# Patient Record
Sex: Male | Born: 1985 | Hispanic: Yes | Marital: Married | Smoking: Never smoker
Health system: Southern US, Community
[De-identification: ages and names within clinical notes are randomized; demographics above are authoritative.]

---

## 2014-03-22 ENCOUNTER — Emergency Department: Payer: Self-pay | Admitting: Emergency Medicine

## 2016-01-02 ENCOUNTER — Encounter: Payer: Self-pay | Admitting: Emergency Medicine

## 2016-01-02 ENCOUNTER — Emergency Department
Admission: EM | Admit: 2016-01-02 | Discharge: 2016-01-02 | Disposition: A | Discharge status: Home / Self Care | Payer: Self-pay | Attending: Emergency Medicine | Admitting: Emergency Medicine

## 2016-01-02 DIAGNOSIS — W11XXXA Fall on and from ladder, initial encounter: Secondary | ICD-10-CM | POA: Insufficient documentation

## 2016-01-02 DIAGNOSIS — M5410 Radiculopathy, site unspecified: Secondary | ICD-10-CM | POA: Insufficient documentation

## 2016-01-02 DIAGNOSIS — Y929 Unspecified place or not applicable: Secondary | ICD-10-CM | POA: Insufficient documentation

## 2016-01-02 DIAGNOSIS — Y9389 Activity, other specified: Secondary | ICD-10-CM | POA: Insufficient documentation

## 2016-01-02 DIAGNOSIS — Y990 Civilian activity done for income or pay: Secondary | ICD-10-CM | POA: Insufficient documentation

## 2016-01-02 DIAGNOSIS — Z87891 Personal history of nicotine dependence: Secondary | ICD-10-CM | POA: Insufficient documentation

## 2016-01-02 DIAGNOSIS — S39012A Strain of muscle, fascia and tendon of lower back, initial encounter: Secondary | ICD-10-CM | POA: Insufficient documentation

## 2016-01-02 MED ORDER — KETOROLAC TROMETHAMINE 10 MG PO TABS: 10.0000 mg | ORAL_TABLET | Freq: Three times a day (TID) | ORAL | Status: AC

## 2016-01-02 MED ORDER — CYCLOBENZAPRINE HCL 5 MG PO TABS: 5.0000 mg | ORAL_TABLET | Freq: Three times a day (TID) | ORAL | Status: AC | PRN

## 2016-01-02 MED ORDER — DIAZEPAM 5 MG PO TABS
5.0000 mg | ORAL_TABLET | Freq: Once | ORAL | Status: AC
  Administered 2016-01-02: 5 mg via ORAL
  Filled 2016-01-02: qty 1

## 2016-01-02 MED ORDER — ORPHENADRINE CITRATE 30 MG/ML IJ SOLN: 60.0000 mg | INTRAMUSCULAR | Status: DC

## 2016-01-02 MED ORDER — KETOROLAC TROMETHAMINE 60 MG/2ML IM SOLN
30.0000 mg | Freq: Once | INTRAMUSCULAR | Status: AC
  Administered 2016-01-02: 30 mg via INTRAMUSCULAR
  Filled 2016-01-02: qty 2

## 2016-01-02 NOTE — Discharge Instructions (Signed)
Prevencin de las lesiones de la espalda (Back Injury Prevention) Las lesiones de la espalda pueden ser muy dolorosas. Adems son difciles de curar. Despus de haber tenido una lesin de la espalda, tiene ms probabilidad de lesionarse otra vez. Es importante que aprenda cmo evitar lesionarse o volver a Control and instrumentation engineer. Los siguientes consejos pueden ayudarlo a Product/process development scientist una lesin de la espalda. QU DEBO SABER SOBRE EL ESTADO FSICO?  Haga ejercicios durante 27mnutos diarios la mHartford Financialde la semana o como se lo haya indicado el mdico. AChief Strategy Officerde lo siguiente:  HSherilyn Cooterejercicios aerbicos, como caminar, trotar, andar en bicicleta o nadar.  Haga ejercicios que mejoren el equilibrio y la fuerza, cWatovatai chi y el yoga. Estos pueden reducir el riesgo de que se caiga y se lesione la espalda.  Haga ejercicios de elongacin para ayudar a la flexibilidad.  Intente desarrollar msculos abdominales fuertes. Los msculos del abdomen brindan gran parte del sostn que la espalda necesita.  Mantenga un peso saludable. Esto ayuda a reducir eCatering managerde sufrir una lesin de la espalda. QU DEBO SABER SOBRE LA DGrand [place]  Hable con el mdico sobre la dieta en general. TBassettvitaminas y los suplementos solamente como se lo haya indicado el mdico.  Hable con el mdico sobre la cantidad de calcio y vitaminaD que necesita a diario. Estos nutrientes ayudan a eInsurance risk surveyorde los huesos (osteoporosis). La osteoporosis puede derivar en fracturas seas, lo que puede causar dolor de espalda.  Incluya buenas fuentes de calcio en la dieta, como productos lcteos, verduras de hojas verdes y productos con agregado de calcio (fortificados).  Incluya buenas fuentes de vitaminaD en la dieta, como leche y alimentos fortificados con esta vitamina. QAllen  Sintese y prese erguido. No se incline hacia adelante al sentarse ni se encorve al pararse.  Elija  las sillas con un buen apoyo para la zona baja de la espalda (lumbar).  Si trabaja en un escritorio, sintese cerca de este para no tener que inclinarse. Mantenga el mentn hacia abajo. Mantenga el cuello hacia atrs y los codos flexionados en ngulo recto. La posicin de los brazos debe verse como la letra "L".  Cuando conduzca, sintese elevado y cerca del volante. Agregue un apoyo para la zona lumbar al asiento del automvil, si es necesario.  No permanezca sentado ni parado en una posicin durante mThe PNC Financial Tmese descansos para pararse, estirarse y caminar, al menos una vez por hora. Tmese descansos cada hora si conduce durante perodos largos de tiempo.  Duerma de costado con las rodillas apenas dobladas o boca arriba con una almohada debajo de las rodillas. No duerma boca abajo. QWest Haverstraw SOBRE LOS MOVIMIENTOS DE TORSIN Y LOS DE ESTIRAMIENTO Levantamiento de objetos y de cargas pesadas  No levante cargas pesadas y evite especialmente el levantamiento repetitivo de objetos pesados. Si debe levantar cargas pesadas:  Elongue antes de hacerlo.  Trabaje lentamente.  Descanse despus de cada levantamiento.  Use una herramienta, como un carrito o una plataforma mvil para mover los objetos, si hay una disponible.  Haga varios viajes cortos, en lugar de llevar una carga pesada.  Pida ayuda cuando la necesite, en especial cuando mueva objetos de gran tamao.  Siga estos pasos cuando levante objetos:  Prese con los pies separados al ancho de los hombros.  Acrquese al objeto tanto como pueda. No intente levantar un objeto pesado que est lejos  de su cuerpo.  Use agarraderas o correas de elevacin si estn disponibles.  [place]. Agchese, pero mantenga los [employer].  Mantenga los hombros [place] atrs, el mentn hacia abajo y la espalda derecha.  Levante lentamente el objeto mientras contrae los msculos de las  piernas, el abdomen y los glteos. Mantenga el objeto tan cerca del centro del cuerpo como sea posible.  Siga estos pasos cuando baje una carga pesada:  Prese con los pies separados al ancho de los hombros.  Baje lentamente el objeto mientras contrae los msculos de las piernas, el abdomen y los glteos. Mantenga el objeto tan cerca del centro del cuerpo como sea posible.  Mantenga los hombros [place] atrs, el mentn hacia abajo y la espalda derecha.  [place]. Agchese, pero mantenga los [employer]in.  Use agarraderas o correas de elevacin si estn disponibles. Movimientos de torsin y de estiramiento  No levante objetos pesados por encima del nivel de la cintura.  No tuerza la cintura mientras levanta o transporta una carga. Si tiene que girar, [employer].  No se incline sin flexionar las rodillas.  No se estire para alcanzar un objeto que est por encima de su cabeza, al otro lado de una mesa o sobre una superficie elevada. [place]?  Evite los pisos mojados y los suelos helados. Retire el hielo de las aceras para evitar las cadas.  No duerma sobre un colchn muy blando ni muy duro.  Mantenga los objetos que [place] a menudo en lugares de fcil acceso.  Coloque los objetos ms pesados en estantes a nivel de la cintura y los ms livianos en estantes ms bajos o ms altos.  Encuentre formas de reducir Dealer, como hacer ejercicios, darse masajes o aplicar tcnicas de relajacin. El estrs puede [employer]. Los msculos en estado de tensin son ms propensos a las lesiones.  Hable con el mdico si se siente ansioso o deprimido. Estas afecciones pueden intensificar el dolor de espalda.  Use calzado sin tacones con suelas acolchonadas.  Evite los movimientos repentinos.  Use ambas correas de sujecin cuando cargue una mochila.  No consuma ningn producto que contenga tabaco, lo que incluye cigarrillos, tabaco  de Higher education careers adviser o Psychologist, sport and exercise. Si necesita ayuda para dejar de fumar, consulte al mdico.   Esta informacin no tiene Marine scientist el consejo del mdico. Asegrese de hacerle al mdico cualquier pregunta que tenga.   Document Released: 07/30/2005 Document Revised: 12/14/2014 Elsevier Interactive Patient Education 2016 [place] lumbosacra (Lumbosacral Strain) La distensin lumbosacra es una distensin de cualquiera de las partes que componen las vrtebras lumbosacras. Las vrtebras lumbosacras son los huesos que conforman el tercio inferior de la columna vertebral. Estas vrtebras estn sostenidas por msculos y un resistente tejido fibroso (ligamentos).  CAUSAS  Un golpe repentino en la espalda puede provocar una distensin lumbosacra. Adems, cualquier tipo de movimiento que cause una elongacin excesiva de los msculos de la zona lumbar puede provocar este tipo de distensin. Esto se ve normalmente en las personas que se esfuerzan demasiado, se caen, levantan objetos pesados, se agachan o estn en cuclillas con regularidad. [place] agotador.  Participar en deportes en los cuales se deba empujar o tirar y que requieren de un giro repentino de la espalda (tenis, golf, bisbol).  Levantar peso.  Curvatura excesiva de la zona lumbar.  Pelvis hacia adelante.  Espalda o msculos abdominales dbiles,  o ambos.  Tendones isquiotibiales tensos. SIGNOS Y SNTOMAS  La distensin lumbosacra puede provocar dolor en la zona de la lesin o un dolor que baja (se extiende) hasta la pierna.  DIAGNSTICO Con frecuencia, el mdico puede diagnosticar una distensin Starbucks Corporation un examen fsico. En algunos casos, es posible que deba realizarse pruebas, como una [place].  TRATAMIENTO  El tratamiento para la lesin lumbar depende de muchos factores que el mdico Personnel officer. Sin embargo, la [employer] de los tratamientos incluye el uso de  antiinflamatorios. INSTRUCCIONES PARA EL CUIDADO EN EL HOGAR   Evite actividades fsicas difciles (tenis, raquetbol, esqu acutico) si no tiene un buen estado fsico para practicarlas. Esto puede Museum/gallery conservator.  Si tiene un problema en la espalda, evite los deportes que requieren de movimientos corporales bruscos. La natacin y las caminatas son las actividades ms seguras.  Mantenga una buena postura.  Mantenga un peso saludable.  En el caso de episodios agudos, puede colocar hielo en la zona lesionada.  Ponga el hielo en una bolsa plstica.  Coloque una toalla entre la piel y la bolsa de hielo.  Deje el hielo durante 20 minutos, 2 a 3 veces por da.  Cuando la zona lumbar comience a sanar, es posible que le recomienden ejercicios de elongacin y fortalecimiento. SOLICITE ATENCIN MDICA SI:  El dolor de Loss adjuster, chartered.  Tiene un dolor de espalda intenso que no mejora con medicamentos. SOLICITE ATENCIN [place] DE INMEDIATO SI:   Siente entumecimiento, hormigueo, debilidad o problemas con el uso de los brazos o las piernas.  Nota cambios en el control de la vejiga o el intestino.  Siente un aumento del Chief Operating Officer parte del cuerpo, incluido el vientre (abdomen).  Nota que le falta el aire, se siente mareado o se desmaya.  Tiene Higher education careers adviser (nuseas), vomita o comienza a sudar.  Nota un cambio de color en los dedos del pie o las piernas, o los pies se ponen muy fros. ASEGRESE DE QUE:   Comprende estas instrucciones.  Controlar su afeccin.  Recibir ayuda de inmediato si no mejora o si empeora.   Esta informacin no tiene Marine scientist el consejo del mdico. Asegrese de hacerle al mdico cualquier pregunta que tenga.   Document Released: 05/09/2005 Document Revised: 08/04/2013 Elsevier Interactive Patient Education 2016 [place] Radicular (Radicular Pain) El dolor radicular est causado en la irritacin de las  races [place]. Generalmente la causa es una degeneracin el un disco de la columna vertebral. Esto produce dolor que se siente en el brazo o la pierna. Entre otras causas del dolor radicular se incluyen:  Designer, jewellery  Neuropatas (un estado anormal y a menudo degenerativo del sistema nervioso o nervios). Para el diagnstico le indicarn una tomografa computarizada o imgenes por resonancia magntica para determinar la causa primaria. Los nervios que comienzan en el cuello (races nerviosas) pueden Psychologist, clinical radicular en la parte exterior del hombro y en el brazo. ste puede extenderse hacia el pulgar y los dedos. Los sntomas varan segn la raz nerviosa afectada. En la mayor parte de los casos mejora en gran medida con un tratamiento conservador. Para los problemas en el cuello le indicarn fisioterapia, un collar o una traccin cervical. El tratamiento puede durar varias semanas y se considerar la posibilidad de una ciruga si los sntomas no mejoran.  El tratamiento conservador tambin se recomienda para los casos de citica que causan dolor que se irradia desde la zona  baja de la espalda o nalgas hacia la pierna o el pie. Generalmente hay una historia previa de problemas en la espalda. La mayora de los pacientes mejora completamente luego de 2 a 4 semanas de reposo en cama y otros tratamientos de apoyo. El reposo en cama reduce la presin en el disco considerablemente. El estar sentado no es una buena posicin, ya que aumenta la presin sobre el disco. Evite encorvarse, levantar mucho peso, permanecer sentado por [employer], y las actividades que puedan empeorar el problema. El los casos ms graves se Film/video editor traccin. La ciruga est reservada para los pacientes que no mejoran dentro de los primeros meses de [place]. Utilice los medicamentos de venta libre o de prescripcin para Conservation officer, historic buildings, Health and safety inspector o la [place], segn se lo indique el  profesional que lo asiste. Los narcticos y los relajantes musculares ayudan a Academic librarian dolor ms intenso y los espasmos, proporcionando una sedacin [place]. Venida Jarvis de fro o masajes tambin le brindarn [place]. No se recomienda la manipulacin de la columna vertebral. Esto puede aumentar el grado de protrusin del disco. Las inyecciones epidurales con esteroides son a menudo un tratamiento efectivo para el dolor radicular. Esas inyecciones colocan medicamentos al nervio espinal en el espacio entre la cubierta protectora de la mdula espinal y los huesos de la espalda (vertebrae). El profesional que lo asiste podr darle ms informacin acerca de las inyecciones de esteroides. Estas inyecciones son ms efectivas cuando se administran dentro de las [employer]V de la aparicin del Social research officer, government.  Comunquese con su mdico para realizar un control segn las indicaciones. Como parte importante del tratamiento deber seguir un programa de rehabilitacin, con ejercicios de elongacin y fortalecimiento.  SOLICITE ATENCIN MDICA DE INMEDIATO SI:  Presenta siente debilidad o adormecimiento inusual en sus brazos o piernas.  Presenta prdida del control del intestino o de la vejiga.  Presenta dolor abdominal.   Esta informacin no tiene como fin reemplazar el consejo del mdico. Asegrese de hacerle al mdico cualquier pregunta que tenga.   Document Released: 07/30/2005 Document Revised: 10/22/2011 Elsevier Interactive Patient Education 2016 [place] the prescription meds as directed. Rest but do not lay in bed all day. Apply ice or moist heat to the lower back for pain. Follow-up with [facility] for continued symptoms.   ===================================================================================  Tome la medicina que se le ha recetado siguiendo las instrucciones.  Descanse pero no se acueste en la cama todo el da. Ponga hielo o paos calientes y hmedos en la parte baja de la espalda  para el dolor. Haga seguimiento con [facility] si contina con sntomas.

## 2016-01-02 NOTE — ED Notes (Addendum)
States back pain x 1 week, States radiates to L leg. States slipped on stairs at work on Monday. Works for [employer], Nurse, learning disability, [place].

## 2016-01-02 NOTE — ED Notes (Signed)
States he is having pain to lower back for about 1 1/2 weeks   States he fell from ladder ambulates well to treatment area

## 2016-01-02 NOTE — ED Provider Notes (Signed)
[facility] Emergency Department Provider Note ____________________________________________  Time seen: 1518  I have reviewed the triage vital signs and the nursing notes.  HISTORY  Chief Complaint  Back Pain  History limited by Spanish language. Interpreter Jeanie Sewer) present during the interview and exam.  HPI  Todd Harper is a 30 y.o. male who presents to the ED for evaluation of continued LBP since injury, 1 1/2 weeks PTA. He describes his initial injury occurred when he fell from a scaffold ramp. He fell from about 5 steps above next level. He describes pain to the lower back that radiates into his left leg. He denies bladder or bowel incontinence, leg weakness, or foot drop. He was evaluated at [facility]gent care on the day of the accident. He was x-rayed and discharged with prescriptions for Flexeril and Naprosyn. He returned about 5 days later for re-evaluation with continued symptoms. He was returned to work with modified duty, but reports the supervisors did not honor his restrictions. He rates his discomfort at 7/10 in triage.    History reviewed. No pertinent past medical history.  There are no active problems to display for this patient.  History reviewed. No pertinent past surgical history.  Current Outpatient Rx  Name  Route  Sig  Dispense  Refill  . cyclobenzaprine (FLEXERIL) 5 MG tablet   Oral   Take 1 tablet (5 mg total) by mouth 3 (three) times daily as needed for muscle spasms.   15 tablet   0   . ketorolac (TORADOL) 10 MG tablet   Oral   Take 1 tablet (10 mg total) by mouth every 8 (eight) hours.   15 tablet   0    Allergies Review of patient's allergies indicates no known allergies.  No family history on file.  Social History Social History  Substance Use Topics  . Smoking status: Former Research scientist (life sciences)  . Smokeless tobacco: None  . Alcohol Use: No   Review of Systems  Constitutional: Negative for fever. Respiratory:  Negative for shortness of breath. Gastrointestinal: Negative for abdominal pain, vomiting and diarrhea. Genitourinary: Negative for dysuria. Musculoskeletal: Positive for back pain. Neurological: Negative for headaches, focal weakness or numbness. ____________________________________________  PHYSICAL EXAM:  VITAL SIGNS: ED Triage Vitals  Enc Vitals Group     BP 01/02/16 1357 147/89 mmHg     Pulse Rate 01/02/16 1357 92     Resp 01/02/16 1357 20     Temp 01/02/16 1357 97.7 F (36.5 C)     Temp Source 01/02/16 1357 Oral     SpO2 01/02/16 1357 97 %     Weight 01/02/16 1358 195 lb (88.451 kg)     Height 01/02/16 1358 5\' 5"  (1.651 m)     Head Cir --      Peak Flow --      Pain Score 01/02/16 1358 7     Pain Loc --      Pain Edu? --      Excl. in [place]? --    Constitutional: Alert and oriented. Well appearing and in no distress. Head: Normocephalic and atraumatic. Cardiovascular: Normal rate, regular rhythm.  Respiratory: Normal respiratory effort. No wheezes/rales/rhonchi. Gastrointestinal: Soft and nontender. No distention. Musculoskeletal: Normal spinal alignment without midline tenderness, spasm, deformity, or step-off. Patient with normal transition from sit to stand. He has a negative Trendelenburg on exam. He is able to toe raise and heel raise without difficulty. He has full lumbar flexion and limited extension secondary to pain.  This tenderness is localized to the left lumbar sacral junction. Nontender with normal range of motion in all extremities.  Neurologic:  CNs 2 through 12 grossly intact. Normal LE DTRs bilaterally. Normal gait without ataxia. Normal speech and language. No gross focal neurologic deficits are appreciated. Skin:  Skin is warm, dry and intact. No rash noted. ____________________________________________  PROCEDURES  Toradol 30 mg IM Valium 5 mg PO ____________________________________________  INITIAL IMPRESSION / ASSESSMENT AND PLAN / ED  COURSE  Patient with acute lumbar strain with mild left radicular symptoms. He reports pain at discharge at 1/10 following medication administration. He will be discharged with prescriptions for Toradol and Flexeril. He will be held out of work for the next 3 days and return to work on Thursday with a lifting restriction of no greater than 25 pounds. He will follow-up with [facility] urgent care for ongoing symptom management. Return to the ED as needed.  ____________________________________________  FINAL CLINICAL IMPRESSION(S) / ED DIAGNOSES  Final diagnoses:  Lumbar strain, initial encounter  Radicular pain of left lower extremity     Melvenia Needles, PA-C 01/02/16 1729  Carrie Mew, MD 01/02/16 2033

## 2017-04-10 ENCOUNTER — Encounter: Payer: Self-pay | Admitting: *Deleted

## 2017-04-10 ENCOUNTER — Emergency Department
Admission: EM | Admit: 2017-04-10 | Discharge: 2017-04-10 | Disposition: A | Discharge status: Home / Self Care | Payer: Self-pay | Attending: Emergency Medicine | Admitting: Emergency Medicine

## 2017-04-10 ENCOUNTER — Emergency Department: Payer: Self-pay

## 2017-04-10 DIAGNOSIS — K219 Gastro-esophageal reflux disease without esophagitis: Secondary | ICD-10-CM | POA: Insufficient documentation

## 2017-04-10 LAB — CBC
HEMATOCRIT: 42.5 % (ref 40.0–52.0)
Hemoglobin: 14.6 g/dL (ref 13.0–18.0)
MCH: 28.9 pg (ref 26.0–34.0)
MCHC: 34.3 g/dL (ref 32.0–36.0)
MCV: 84.2 fL (ref 80.0–100.0)
PLATELETS: 269 10*3/uL (ref 150–440)
RBC: 5.04 MIL/uL (ref 4.40–5.90)
RDW: 13.1 % (ref 11.5–14.5)
WBC: 12.3 10*3/uL — ABNORMAL HIGH (ref 3.8–10.6)

## 2017-04-10 LAB — COMPREHENSIVE METABOLIC PANEL
ALK PHOS: 51 U/L (ref 38–126)
ALT: 73 U/L — ABNORMAL HIGH (ref 17–63)
ANION GAP: 12 (ref 5–15)
AST: 36 U/L (ref 15–41)
Albumin: 4.6 g/dL (ref 3.5–5.0)
BILIRUBIN TOTAL: 0.5 mg/dL (ref 0.3–1.2)
BUN: 14 mg/dL (ref 6–20)
CALCIUM: 9.7 mg/dL (ref 8.9–10.3)
CO2: 28 mmol/L (ref 22–32)
Chloride: 102 mmol/L (ref 101–111)
Creatinine, Ser: 0.75 mg/dL (ref 0.61–1.24)
GFR calc non Af Amer: >60 mL/min (ref >60)
Glucose, Bld: 112 mg/dL — ABNORMAL HIGH (ref 65–99)
Potassium: 4.2 mmol/L (ref 3.5–5.1)
SODIUM: 142 mmol/L (ref 135–145)
TOTAL PROTEIN: 7.6 g/dL (ref 6.5–8.1)

## 2017-04-10 LAB — URINALYSIS, COMPLETE (UACMP) WITH MICROSCOPIC
BACTERIA UA: NONE SEEN
BILIRUBIN URINE: NEGATIVE
Glucose, UA: NEGATIVE mg/dL
Hgb urine dipstick: NEGATIVE
Ketones, ur: NEGATIVE mg/dL
LEUKOCYTES UA: NEGATIVE
Nitrite: NEGATIVE
PH: 6 (ref 5.0–8.0)
Protein, ur: NEGATIVE mg/dL
SPECIFIC GRAVITY, URINE: 1.024 (ref 1.005–1.030)

## 2017-04-10 LAB — LIPASE, BLOOD: Lipase: 34 U/L (ref 11–51)

## 2017-04-10 LAB — TROPONIN I

## 2017-04-10 MED ORDER — ONDANSETRON HCL 4 MG/2ML IJ SOLN
4.0000 mg | Freq: Once | INTRAMUSCULAR | Status: AC
  Administered 2017-04-10: 4 mg via INTRAVENOUS

## 2017-04-10 MED ORDER — GI COCKTAIL ~~LOC~~
ORAL | Status: AC
  Administered 2017-04-10: 30 mL via ORAL
  Filled 2017-04-10: qty 30

## 2017-04-10 MED ORDER — FAMOTIDINE IN NACL 20-0.9 MG/50ML-% IV SOLN
INTRAVENOUS | Status: AC
  Administered 2017-04-10: 20 mg via INTRAVENOUS
  Filled 2017-04-10: qty 50

## 2017-04-10 MED ORDER — FAMOTIDINE 20 MG PO TABS: 20.0000 mg | ORAL_TABLET | Freq: Two times a day (BID) | ORAL | 1 refills | Status: AC

## 2017-04-10 MED ORDER — ONDANSETRON HCL 4 MG/2ML IJ SOLN
INTRAMUSCULAR | Status: AC
  Administered 2017-04-10: 4 mg via INTRAVENOUS
  Filled 2017-04-10: qty 2

## 2017-04-10 MED ORDER — GI COCKTAIL ~~LOC~~
30.0000 mL | Freq: Once | ORAL | Status: AC
Start: 2017-04-10 — End: 2017-04-10
  Administered 2017-04-10: 30 mL via ORAL

## 2017-04-10 MED ORDER — ALUM & MAG HYDROXIDE-SIMETH 400-400-40 MG/5ML PO SUSP: 5.0000 mL | Freq: Four times a day (QID) | ORAL | 0 refills | Status: AC | PRN

## 2017-04-10 MED ORDER — FAMOTIDINE IN NACL 20-0.9 MG/50ML-% IV SOLN
20.0000 mg | Freq: Once | INTRAVENOUS | Status: AC
  Administered 2017-04-10: 20 mg via INTRAVENOUS
  Filled 2017-04-10: qty 50

## 2017-04-10 NOTE — ED Triage Notes (Addendum)
Pt has abd pain with vomiting x 1 and diarrhea x 1 for 1 day. Pt drinking water in triage.   No fever.  Interpreter with pt.   Pt alert.

## 2017-04-10 NOTE — ED Notes (Signed)

## 2017-04-10 NOTE — ED Notes (Signed)
Pt reports burning epigastric pain for approx 9-10 days, that lessens when eating foods without fat and when drinking sprite. Pt states N/V present, especially when taking "too many" tums. Pt reports OTC tums and zantac in addition to a daily laxative have provided minimal relief.

## 2017-04-10 NOTE — ED Provider Notes (Signed)
[facility] Emergency Department Provider Note  ____________________________________________  Time seen: Approximately 9:53 PM  I have reviewed the triage vital signs and the nursing notes.   HISTORY  Chief Complaint Abdominal Pain   HPI Todd Harper is a 31 y.o. male no significant past medical history who presents for evaluation of abdominal pain. Patient reports 10 days of epigastric burning pain radiating up to his chest associated with nausea and vomiting.Patient reports initially the pain was worse after he ate however over the last few days eating makes the pain better. He has been taking Tums and Zantac with minimal relief. He denies chest pain or shortness of breath, fever or chills, diarrhea or constipation. He does endorse mild dysuria since yesterday. The pain is currently mild, located in his epigastrium and nonradiating. He denies heavy NSAID use, alcohol use, or smoking. No personal or family history of ischemic heart disease. No melena or hematemesis  No past medical history on file.  There are no active problems to display for this patient.   No past surgical history on file.  Prior to Admission medications   Medication Sig Start Date End Date Taking? Authorizing Provider  alum & mag hydroxide-simeth (MAALOX MAX) 400-400-40 MG/5ML suspension Take 5 mLs by mouth every 6 (six) hours as needed for indigestion. 04/10/17   Rudene Re, MD  famotidine (PEPCID) 20 MG tablet Take 1 tablet (20 mg total) by mouth 2 (two) times daily. 04/10/17 04/10/18  Rudene Re, MD    Allergies Patient has no known allergies.  No family history on file.  Social History Social History  Substance Use Topics  . Smoking status: Never Smoker  . Smokeless tobacco: Never Used  . Alcohol use No    Review of Systems  Constitutional: Negative for fever. Eyes: Negative for visual changes. ENT: Negative for sore throat. Neck: No neck  pain  Cardiovascular: Negative for chest pain. Respiratory: Negative for shortness of breath. Gastrointestinal: + epigastric burning abdominal pain, nausea, and vomiting. No diarrhea. Genitourinary: + dysuria. Musculoskeletal: Negative for back pain. Skin: Negative for rash. Neurological: Negative for headaches, weakness or numbness. Psych: No SI or HI  ____________________________________________   PHYSICAL EXAM:  VITAL SIGNS: ED Triage Vitals [04/10/17 2101]  Enc Vitals Group     BP 140/86     Pulse Rate 77     Resp 20     Temp 98.6 F (37 C)     Temp Source Oral     SpO2 99 %     Weight 200 lb (90.7 kg)     Height 5\' 6"  (1.676 m)     Head Circumference      Peak Flow      Pain Score 9     Pain Loc      Pain Edu?      Excl. in [place]?     Constitutional: Alert and oriented. Well appearing and in no apparent distress. HEENT:      Head: Normocephalic and atraumatic.         Eyes: Conjunctivae are normal. Sclera is non-icteric.       Mouth/Throat: Mucous membranes are moist.       Neck: Supple with no signs of meningismus. Cardiovascular: Regular rate and rhythm. No murmurs, gallops, or rubs. 2+ symmetrical distal pulses are present in all extremities. No JVD. Respiratory: Normal respiratory effort. Lungs are clear to auscultation bilaterally. No wheezes, crackles, or rhonchi.  Gastrointestinal: Soft, tenderness to palpation on the  epigastric region, and non distended with positive bowel sounds. No rebound or guarding. Genitourinary: No CVA tenderness. Musculoskeletal: Nontender with normal range of motion in all extremities. No edema, cyanosis, or erythema of extremities. Neurologic: Normal speech and language. Face is symmetric. Moving all extremities. No gross focal neurologic deficits are appreciated. Skin: Skin is warm, dry and intact. No rash noted. Psychiatric: Mood and affect are normal. Speech and behavior are  normal.  ____________________________________________   LABS (all labs ordered are listed, but only abnormal results are displayed)  Labs Reviewed  COMPREHENSIVE METABOLIC PANEL - Abnormal; Notable for the following:       Result Value   Glucose, Bld 112 (*)    ALT 73 (*)    All other components within normal limits  CBC - Abnormal; Notable for the following:    WBC 12.3 (*)    All other components within normal limits  URINALYSIS, COMPLETE (UACMP) WITH MICROSCOPIC - Abnormal; Notable for the following:    Color, Urine YELLOW (*)    APPearance CLEAR (*)    Squamous Epithelial / LPF 0-5 (*)    All other components within normal limits  LIPASE, BLOOD  TROPONIN I   ____________________________________________  EKG  ED ECG REPORT I, Rudene Re, the attending physician, personally viewed and interpreted this ECG.  Normal sinus rhythm, rate of 81, normal intervals, normal axis, LVH, T-wave inversions in lead 3, no ST elevations or depressions. No prior for comparison. ____________________________________________  RADIOLOGY  CXR: negative  ____________________________________________   PROCEDURES  Procedure(s) performed: None Procedures Critical Care performed:  None ____________________________________________   INITIAL IMPRESSION / ASSESSMENT AND PLAN / ED COURSE  31 y.o. male no significant past medical history who presents for evaluation of 10 days of burning epigastric abdominal pain now associated with nausea and vomiting. Patient is extremely well appearing, in no distress, has normal vital signs, he has mild epigastric tenderness to palpation with no rebound or guarding. EKG with no ischemic changes. I do believe patient's presentation is due to gastritis versus GERD versus peptic ulcer disease. We'll treat with a GI cocktail, Zofran and Pepcid. We'll check labs to rule out pancreatitis, gallbladder disease, pyelonephritis.      _________________________ 10:41 PM on 04/10/2017 -----------------------------------------  Patient reports resolution of the pain with Pepcid, GI cocktail and Zofran.blood work with slight leukocytosis but no other significant abnormalities. Patient be discharged home on Maalox, Pepcid, and referral to [facility] clinic for close f/u. Discussed return precautions with patient and recommended that he return to the emergency room if he has right upper quadrant or right lower quadrant pain, fever or any new symptoms. Repeat abdominal exam at this time showing a soft abdomen with no tenderness throughout.  Pertinent labs & imaging results that were available during my care of the patient were reviewed by me and considered in my medical decision making (see chart for details).    ____________________________________________   FINAL CLINICAL IMPRESSION(S) / ED DIAGNOSES  Final diagnoses:  Gastroesophageal reflux disease, esophagitis presence not specified      NEW MEDICATIONS STARTED DURING THIS VISIT:  New Prescriptions   ALUM & MAG HYDROXIDE-SIMETH (MAALOX MAX) 992-426-83 MG/5ML SUSPENSION    Take 5 mLs by mouth every 6 (six) hours as needed for indigestion.   FAMOTIDINE (PEPCID) 20 MG TABLET    Take 1 tablet (20 mg total) by mouth 2 (two) times daily.     Note:  This document was prepared using Systems analyst and may include  unintentional dictation errors.    Rudene Re, MD 04/10/17 2242

## 2017-04-11 ENCOUNTER — Encounter: Payer: Self-pay | Admitting: Emergency Medicine

## 2020-10-19 ENCOUNTER — Emergency Department
Admission: EM | Admit: 2020-10-19 | Discharge: 2020-10-19 | Disposition: A | Discharge status: Home / Self Care | Payer: Self-pay | Attending: Emergency Medicine | Admitting: Emergency Medicine

## 2020-10-19 ENCOUNTER — Other Ambulatory Visit: Payer: Self-pay

## 2020-10-19 ENCOUNTER — Emergency Department: Payer: Self-pay

## 2020-10-19 ENCOUNTER — Encounter: Payer: Self-pay | Admitting: Emergency Medicine

## 2020-10-19 DIAGNOSIS — E236 Other disorders of pituitary gland: Secondary | ICD-10-CM | POA: Insufficient documentation

## 2020-10-19 DIAGNOSIS — G510 Bell's palsy: Secondary | ICD-10-CM | POA: Insufficient documentation

## 2020-10-19 LAB — CBC WITH DIFFERENTIAL/PLATELET
Abs Immature Granulocytes: 0.04 10*3/uL (ref 0.00–0.07)
Basophils Absolute: 0 10*3/uL (ref 0.0–0.1)
Basophils Relative: 0 %
Eosinophils Absolute: 0 10*3/uL (ref 0.0–0.5)
Eosinophils Relative: 1 %
HCT: 45.8 % (ref 39.0–52.0)
Hemoglobin: 15.3 g/dL (ref 13.0–17.0)
Immature Granulocytes: 1 %
Lymphocytes Relative: 39 %
Lymphs Abs: 3.2 10*3/uL (ref 0.7–4.0)
MCH: 28.2 pg (ref 26.0–34.0)
MCHC: 33.4 g/dL (ref 30.0–36.0)
MCV: 84.3 fL (ref 80.0–100.0)
Monocytes Absolute: 0.5 10*3/uL (ref 0.1–1.0)
Monocytes Relative: 6 %
Neutro Abs: 4.5 10*3/uL (ref 1.7–7.7)
Neutrophils Relative %: 53 %
Platelets: 264 10*3/uL (ref 150–400)
RBC: 5.43 MIL/uL (ref 4.22–5.81)
RDW: 12.8 % (ref 11.5–15.5)
WBC: 8.3 10*3/uL (ref 4.0–10.5)
nRBC: 0 % (ref 0.0–0.2)

## 2020-10-19 LAB — BASIC METABOLIC PANEL
Anion gap: 11 (ref 5–15)
BUN: 9 mg/dL (ref 6–20)
CO2: 23 mmol/L (ref 22–32)
Calcium: 9.4 mg/dL (ref 8.9–10.3)
Chloride: 105 mmol/L (ref 98–111)
Creatinine, Ser: 0.42 mg/dL — ABNORMAL LOW (ref 0.61–1.24)
GFR, Estimated: >60 mL/min (ref >60)
Glucose, Bld: 107 mg/dL — ABNORMAL HIGH (ref 70–99)
Potassium: 3.8 mmol/L (ref 3.5–5.1)
Sodium: 139 mmol/L (ref 135–145)

## 2020-10-19 MED ORDER — PREDNISONE 20 MG PO TABS: 60.0000 mg | ORAL_TABLET | Freq: Every day | ORAL | 0 refills | Status: AC

## 2020-10-19 MED ORDER — ONDANSETRON HCL 4 MG/2ML IJ SOLN: 4.0000 mg | Freq: Once | INTRAMUSCULAR | Status: AC

## 2020-10-19 MED ORDER — ERYTHROMYCIN 5 MG/GM OP OINT: 1.0000 "application " | TOPICAL_OINTMENT | Freq: Every day | OPHTHALMIC | 0 refills | Status: AC

## 2020-10-19 MED ORDER — MORPHINE SULFATE (PF) 4 MG/ML IV SOLN
4.0000 mg | Freq: Once | INTRAVENOUS | Status: AC
  Administered 2020-10-19: 4 mg via INTRAVENOUS
  Filled 2020-10-19: qty 1

## 2020-10-19 MED ORDER — IOHEXOL 300 MG/ML  SOLN
75.0000 mL | Freq: Once | INTRAMUSCULAR | Status: AC | PRN
  Administered 2020-10-19: 75 mL via INTRAVENOUS

## 2020-10-19 MED ORDER — ONDANSETRON HCL 4 MG/2ML IJ SOLN
INTRAMUSCULAR | Status: AC
  Administered 2020-10-19: 4 mg via INTRAVENOUS
  Filled 2020-10-19: qty 2

## 2020-10-19 NOTE — ED Provider Notes (Addendum)
[facility] Emergency Department Provider Note   ____________________________________________   Event Date/Time   First MD Initiated Contact with Patient 10/19/20 1008     (approximate)  I have reviewed the triage vital signs and the nursing notes.   HISTORY  Chief Complaint Otalgia and Numbness    HPI Todd Harper is a 35 y.o. male with no significant past medical history who presents to the ED complaining of facial pain.  History is limited as patient is Spanish-speaking only and history obtained via in person interpreter.  Patient reports for the past 3 days he has been dealing with tingling pain extending from his right ear under his right jaw.  He feels like the area is slightly swollen and tender to touch.  He denies any fevers, rash, dental pain, or difficulty swallowing.  He has noticed difficulty lifting the right side of his face as well as closing his right eye with onset of the symptoms.  He denies any recent falls or head trauma.  He has never had similar symptoms in the past, does not currently take any medications.  He denies any vision changes, speech changes, numbness or weakness in his extremities.        History reviewed. No pertinent past medical history.  There are no problems to display for this patient.   History reviewed. No pertinent surgical history.  Prior to Admission medications   Medication Sig Start Date End Date Taking? Authorizing Provider  erythromycin ophthalmic ointment Place 1 application into the right eye at bedtime. 10/19/20  Yes Blake Divine, MD  predniSONE (DELTASONE) 20 MG tablet Take 3 tablets (60 mg total) by mouth daily with breakfast for 5 days. 10/19/20 10/24/20 Yes Blake Divine, MD  alum & mag hydroxide-simeth (MAALOX MAX) 510-258-52 MG/5ML suspension Take 5 mLs by mouth every 6 (six) hours as needed for indigestion. 04/10/17   Rudene Re, MD  cyclobenzaprine (FLEXERIL) 5 MG tablet Take  1 tablet (5 mg total) by mouth 3 (three) times daily as needed for muscle spasms. 01/02/16   Menshew, Dannielle Karvonen, PA-C  famotidine (PEPCID) 20 MG tablet Take 1 tablet (20 mg total) by mouth 2 (two) times daily. 04/10/17 04/10/18  Rudene Re, MD  ketorolac (TORADOL) 10 MG tablet Take 1 tablet (10 mg total) by mouth every 8 (eight) hours. 01/02/16   Menshew, Dannielle Karvonen, PA-C    Allergies Patient has no known allergies.  History reviewed. No pertinent family history.  Social History Social History   Tobacco Use  . Smoking status: Never Smoker  . Smokeless tobacco: Never Used  Substance Use Topics  . Alcohol use: No  . Drug use: No    Review of Systems  Constitutional: No fever/chills Eyes: No visual changes. ENT: No sore throat. Cardiovascular: Denies chest pain. Respiratory: Denies shortness of breath. Gastrointestinal: No abdominal pain.  No nausea, no vomiting.  No diarrhea.  No constipation. Genitourinary: Negative for dysuria. Musculoskeletal: Negative for back pain. Skin: Negative for rash. Neurological: Negative for headaches, positive for facial droop and facial pain.  ____________________________________________   PHYSICAL EXAM:  VITAL SIGNS: ED Triage Vitals  Enc Vitals Group     BP 10/19/20 0921 (!) 170/117     Pulse Rate 10/19/20 0921 100     Resp 10/19/20 0921 17     Temp 10/19/20 0921 98.8 F (37.1 C)     Temp Source 10/19/20 0921 Oral     SpO2 10/19/20 0921 99 %  Weight 10/19/20 0922 215 lb (97.5 kg)     Height 10/19/20 0922 5\' 5"  (1.651 m)     Head Circumference --      Peak Flow --      Pain Score 10/19/20 0922 8     Pain Loc --      Pain Edu? --      Excl. in [place]? --     Constitutional: Alert and oriented. Eyes: Conjunctivae are normal. Head: Atraumatic.  Mild tenderness to palpation over right mastoid process and extending along to submandibular area.  No associated erythema, edema, warmth, or fluctuance. Nose: No  congestion/rhinnorhea. Mouth/Throat: Mucous membranes are moist. Ears: TMs clear bilaterally. Neck: Normal ROM Cardiovascular: Normal rate, regular rhythm. Grossly normal heart sounds. Respiratory: Normal respiratory effort.  No retractions. Lungs CTAB. Gastrointestinal: Soft and nontender. No distention. Genitourinary: deferred Musculoskeletal: No lower extremity tenderness nor edema. Neurologic:  Normal speech and language.  Right-sided facial droop noted involving right upper face.  Strength 5 out of 5 in bilateral upper and lower extremities, sensation intact throughout. Skin:  Skin is warm, dry and intact. No rash noted. Psychiatric: Mood and affect are normal. Speech and behavior are normal.  ____________________________________________   LABS (all labs ordered are listed, but only abnormal results are displayed)  Labs Reviewed  BASIC METABOLIC PANEL - Abnormal; Notable for the following components:      Result Value   Glucose, Bld 107 (*)    Creatinine, Ser 0.42 (*)    All other components within normal limits  CBC WITH DIFFERENTIAL/PLATELET     PROCEDURES  Procedure(s) performed (including Critical Care):  Procedures   ____________________________________________   INITIAL IMPRESSION / ASSESSMENT AND PLAN / ED COURSE       35 year old male with no significant past medical history presents to the ED with 3 days of pain extending along the right side of his face with associated facial droop.  There are no obvious signs of infection on exam either intraorally, in right ear, or along the right side of his face.  He does have submandibular tenderness and we will further assess for abscess, sialolith, parotitis or other process with CT scan.  Neurologically, facial findings appear consistent with a facial nerve palsy rather than stroke given involvement of his right upper face.  Lab work is unremarkable.  CT scan is negative for acute process, does show sellar mass  unlikely to be the cause of his symptoms.  This finding was discussed with Dr. Cari Caraway of neurosurgery, who agrees patient may follow-up as an outpatient for this finding.  Otherwise, patient is appropriate for discharge home with course of steroids and erythromycin ointment for suspected Bell's palsy.  He was provided with referral to follow-up with neurology and counseled to return to the ED for new worsening symptoms, patient agrees with plan.      ____________________________________________   FINAL CLINICAL IMPRESSION(S) / ED DIAGNOSES  Final diagnoses:  Bell's palsy  Pituitary mass [facility])     ED Discharge Orders         Ordered    predniSONE (DELTASONE) 20 MG tablet  Daily with breakfast        10/19/20 1306    erythromycin ophthalmic ointment  Daily at bedtime        10/19/20 1306           Note:  This document was prepared using Dragon voice recognition software and may include unintentional dictation errors.   Blake Divine, MD 10/19/20 1540  Blake Divine, MD 10/19/20 [phone]

## 2020-10-19 NOTE — ED Notes (Signed)
Patient transported to CT 

## 2020-10-19 NOTE — ED Triage Notes (Signed)
Pt comes into the ED via POV c/o right ear pain and right facial numbness x 3 days.  Pt ambulatory to triage and in NAD with even and unlabored respirations.  Pt denies any difficulties with hearing but does have right side facial droop.  Pt denies any h/o bells palsy that he is aware of.

## 2020-11-22 ENCOUNTER — Encounter: Payer: Self-pay | Admitting: *Deleted

## 2020-11-22 ENCOUNTER — Emergency Department
Admission: EM | Admit: 2020-11-22 | Discharge: 2020-11-22 | Disposition: A | Discharge status: Home / Self Care | Payer: 59 | Attending: Emergency Medicine | Admitting: Emergency Medicine

## 2020-11-22 ENCOUNTER — Other Ambulatory Visit: Payer: Self-pay

## 2020-11-22 DIAGNOSIS — Z79899 Other long term (current) drug therapy: Secondary | ICD-10-CM | POA: Insufficient documentation

## 2020-11-22 DIAGNOSIS — I10 Essential (primary) hypertension: Secondary | ICD-10-CM | POA: Diagnosis not present

## 2020-11-22 DIAGNOSIS — R079 Chest pain, unspecified: Secondary | ICD-10-CM | POA: Diagnosis present

## 2020-11-22 LAB — CBC WITH DIFFERENTIAL/PLATELET
Abs Immature Granulocytes: 0.03 10*3/uL (ref 0.00–0.07)
Basophils Absolute: 0 10*3/uL (ref 0.0–0.1)
Basophils Relative: 0 %
Eosinophils Absolute: 0 10*3/uL (ref 0.0–0.5)
Eosinophils Relative: 0 %
HCT: 43.5 % (ref 39.0–52.0)
Hemoglobin: 14.8 g/dL (ref 13.0–17.0)
Immature Granulocytes: 0 %
Lymphocytes Relative: 41 %
Lymphs Abs: 4.1 10*3/uL — ABNORMAL HIGH (ref 0.7–4.0)
MCH: 27.8 pg (ref 26.0–34.0)
MCHC: 34 g/dL (ref 30.0–36.0)
MCV: 81.8 fL (ref 80.0–100.0)
Monocytes Absolute: 0.7 10*3/uL (ref 0.1–1.0)
Monocytes Relative: 7 %
Neutro Abs: 5 10*3/uL (ref 1.7–7.7)
Neutrophils Relative %: 52 %
Platelets: 286 10*3/uL (ref 150–400)
RBC: 5.32 MIL/uL (ref 4.22–5.81)
RDW: 13.1 % (ref 11.5–15.5)
WBC: 9.9 10*3/uL (ref 4.0–10.5)
nRBC: 0 % (ref 0.0–0.2)

## 2020-11-22 LAB — COMPREHENSIVE METABOLIC PANEL
ALT: 110 U/L — ABNORMAL HIGH (ref 0–44)
AST: 59 U/L — ABNORMAL HIGH (ref 15–41)
Albumin: 5.2 g/dL — ABNORMAL HIGH (ref 3.5–5.0)
Alkaline Phosphatase: 60 U/L (ref 38–126)
Anion gap: 13 (ref 5–15)
BUN: 8 mg/dL (ref 6–20)
CO2: 24 mmol/L (ref 22–32)
Calcium: 9.9 mg/dL (ref 8.9–10.3)
Chloride: 102 mmol/L (ref 98–111)
Creatinine, Ser: 0.56 mg/dL — ABNORMAL LOW (ref 0.61–1.24)
GFR, Estimated: >60 mL/min (ref >60)
Glucose, Bld: 95 mg/dL (ref 70–99)
Potassium: 3.4 mmol/L — ABNORMAL LOW (ref 3.5–5.1)
Sodium: 139 mmol/L (ref 135–145)
Total Bilirubin: 0.5 mg/dL (ref 0.3–1.2)
Total Protein: 8.2 g/dL — ABNORMAL HIGH (ref 6.5–8.1)

## 2020-11-22 MED ORDER — ERYTHROMYCIN 5 MG/GM OP OINT: 1.0000 "application " | TOPICAL_OINTMENT | Freq: Every day | OPHTHALMIC | 0 refills | Status: AC

## 2020-11-22 MED ORDER — LISINOPRIL 5 MG PO TABS: 5.0000 mg | ORAL_TABLET | Freq: Every day | ORAL | 11 refills | Status: AC

## 2020-11-22 MED ORDER — LISINOPRIL 5 MG PO TABS
5.0000 mg | ORAL_TABLET | Freq: Once | ORAL | Status: AC
  Administered 2020-11-22: 5 mg via ORAL
  Filled 2020-11-22: qty 1

## 2020-11-22 NOTE — ED Notes (Signed)
Reviewed discharge instructions utilizing video interpreter and provided written copy - pt acknowledges verbal understanding and denies any additional questions, concerns, needs.  Pt ambulatory at discharge escorted by wife, pt gait steady an no distress noted.

## 2020-11-22 NOTE — ED Notes (Signed)
Pt lying awake in bed- RR even and unlabored on RA with symmetrical rise and fall of chest.  Vitals within normal limits with exception of elevated bp; provider aware and pt to start on bp med to be prescribed by ED doctor at discharge.  Pt also to be given prescription for Erythromycin related to Bells Palsy L eyelid issues - pt unable to close eyelid at night

## 2020-11-22 NOTE — ED Notes (Signed)
Dr Cheri Fowler made aware of pt current bp 168/122 -- per Dr Cheri Fowler patient still ok for discharge home however provider now to order pt first dose of Lisinopril to take before leaving and pt will continue on this bp med at home (electronic prescription info provided in discharge summary))

## 2020-11-22 NOTE — ED Provider Notes (Signed)
[facility] Emergency Department Provider Note   ____________________________________________   Event Date/Time   First MD Initiated Contact with Patient 11/22/20 1746     (approximate)  I have reviewed the triage vital signs and the nursing notes.   HISTORY  Chief Complaint Hypertension    HPI Todd Harper is a 35 y.o. male with a recently diagnosed pituitary adenoma and Bell's palsy on the right who presents for elevated blood pressure.  Patient does endorse some mild central chest pain/pressure that radiates through to his upper back.  Patient states that this pain has resolved but it does come and go intermittently.  Patient has never needed medication for blood pressure before.  Patient currently denies any vision changes, tinnitus, difficulty speaking, facial droop, sore throat, shortness of breath, abdominal pain, nausea/vomiting/diarrhea, dysuria, or weakness/numbness/paresthesias in any extremity         No past medical history on file.  There are no problems to display for this patient.   No past surgical history on file.  Prior to Admission medications   Medication Sig Start Date End Date Taking? Authorizing Provider  erythromycin ophthalmic ointment Place 1 application into the right eye at bedtime. 11/22/20  Yes Naaman Plummer, MD  lisinopril (ZESTRIL) 5 MG tablet Take 1 tablet (5 mg total) by mouth daily. 11/22/20 11/22/21 Yes Cyani Kallstrom, Vista Lawman, MD  alum & mag hydroxide-simeth (MAALOX MAX) 400-400-40 MG/5ML suspension Take 5 mLs by mouth every 6 (six) hours as needed for indigestion. 04/10/17   Rudene Re, MD  cyclobenzaprine (FLEXERIL) 5 MG tablet Take 1 tablet (5 mg total) by mouth 3 (three) times daily as needed for muscle spasms. 01/02/16   Menshew, Dannielle Karvonen, PA-C  erythromycin ophthalmic ointment Place 1 application into the right eye at bedtime. 10/19/20   Blake Divine, MD  famotidine (PEPCID) 20 MG tablet Take  1 tablet (20 mg total) by mouth 2 (two) times daily. 04/10/17 04/10/18  Rudene Re, MD  ketorolac (TORADOL) 10 MG tablet Take 1 tablet (10 mg total) by mouth every 8 (eight) hours. 01/02/16   Menshew, Dannielle Karvonen, PA-C    Allergies Patient has no known allergies.  No family history on file.  Social History Social History   Tobacco Use  . Smoking status: Never Smoker  . Smokeless tobacco: Never Used  Substance Use Topics  . Alcohol use: No  . Drug use: No    Review of Systems Constitutional: No fever/chills Eyes: No visual changes. ENT: No sore throat. Cardiovascular: Endorses chest pain. Respiratory: Denies shortness of breath. Gastrointestinal: No abdominal pain.  No nausea, no vomiting.  No diarrhea. Genitourinary: Negative for dysuria. Musculoskeletal: Negative for acute arthralgias Skin: Negative for rash. Neurological: Negative for headaches, weakness/numbness/paresthesias in any extremity Psychiatric: Negative for suicidal ideation/homicidal ideation   ____________________________________________   PHYSICAL EXAM:  VITAL SIGNS: ED Triage Vitals  Enc Vitals Group     BP 11/22/20 1744 (!) 177/127     Pulse Rate 11/22/20 1744 97     Resp 11/22/20 1744 18     Temp 11/22/20 1744 98.2 F (36.8 C)     Temp Source 11/22/20 1744 Oral     SpO2 11/22/20 1744 97 %     Weight 11/22/20 1742 215 lb (97.5 kg)     Height 11/22/20 1742 5\' 5"  (1.651 m)     Head Circumference --      Peak Flow --      Pain Score 11/22/20  1742 6     Pain Loc --      Pain Edu? --      Excl. in [place]? --    Constitutional: Alert and oriented. Well appearing and in no acute distress. Eyes: Conjunctivae are normal. PERRL. Head: Atraumatic. Nose: No congestion/rhinnorhea. Mouth/Throat: Mucous membranes are moist. Neck: No stridor Cardiovascular: Grossly normal heart sounds.  Good peripheral circulation. Respiratory: Normal respiratory effort.  No retractions. Gastrointestinal: Soft  and nontender. No distention. Musculoskeletal: No obvious deformities Neurologic:  Normal speech and language.  Facial nerve deficit on the right Skin:  Skin is warm and dry. No rash noted. Psychiatric: Mood and affect are normal. Speech and behavior are normal.  ____________________________________________   LABS (all labs ordered are listed, but only abnormal results are displayed)  Labs Reviewed  COMPREHENSIVE METABOLIC PANEL - Abnormal; Notable for the following components:      Result Value   Potassium 3.4 (*)    Creatinine, Ser 0.56 (*)    Total Protein 8.2 (*)    Albumin 5.2 (*)    AST 59 (*)    ALT 110 (*)    All other components within normal limits  CBC WITH DIFFERENTIAL/PLATELET - Abnormal; Notable for the following components:   Lymphs Abs 4.1 (*)    All other components within normal limits   ____________________________________________  EKG  ED ECG REPORT I, Naaman Plummer, the attending physician, personally viewed and interpreted this ECG.  Date: 11/22/2020 EKG Time: 1741 Rate: 95 Rhythm: normal sinus rhythm QRS Axis: normal Intervals: normal ST/T Wave abnormalities: normal Narrative Interpretation: no evidence of acute ischemia  PROCEDURES  Procedure(s) performed (including Critical Care):  .1-3 Lead EKG Interpretation Performed by: Naaman Plummer, MD Authorized by: Naaman Plummer, MD     Interpretation: normal     ECG rate:  87   ECG rate assessment: normal     Rhythm: sinus rhythm     Ectopy: none     Conduction: normal    __________________________________   INITIAL IMPRESSION / ASSESSMENT AND PLAN / ED COURSE  As part of my medical decision making, I reviewed the following data within the [facility] notes reviewed and incorporated, Labs reviewed, EKG interpreted, Old chart reviewed, Radiograph reviewed and Notes from prior ED visits reviewed and incorporated      Presents to the emergency department  complaining of high blood pressure. Patient is otherwise asymptomatic without confusion, chest pain, hematuria, or SOB.  DDx: CV, AMI, heart failure, renal infarction or failure or other end organ damage.  Disposition: Discussed with patient their elevated blood pressure and need for close outpatient management of their hypertension. Will provide a prescription antihypertensive medication and arrange for the patient to follow up in a primary care clinic      ____________________________________________   FINAL CLINICAL IMPRESSION(S) / ED DIAGNOSES  Final diagnoses:  Hypertension, unspecified type     ED Discharge Orders         Ordered    lisinopril (ZESTRIL) 5 MG tablet  Daily        11/22/20 1909    erythromycin ophthalmic ointment  Daily at bedtime        11/22/20 1909           Note:  This document was prepared using Dragon voice recognition software and may include unintentional dictation errors.   Naaman Plummer, MD 11/22/20 2224

## 2020-11-22 NOTE — ED Triage Notes (Signed)
Pt sent to er for eval of high blood pressure.  Pt has pituitary mass .  Pt reports chest pain , pressure and upper back pain.  Pt alert

## 2020-12-06 ENCOUNTER — Other Ambulatory Visit: Payer: Self-pay | Admitting: Neurosurgery

## 2020-12-06 DIAGNOSIS — E236 Other disorders of pituitary gland: Secondary | ICD-10-CM

## 2020-12-11 ENCOUNTER — Other Ambulatory Visit: Payer: Self-pay

## 2020-12-11 ENCOUNTER — Ambulatory Visit
Admission: RE | Admit: 2020-12-11 | Discharge: 2020-12-11 | Disposition: A | Discharge status: Home / Self Care | Payer: 59 | Source: Ambulatory Visit | Attending: Neurosurgery | Admitting: Neurosurgery

## 2020-12-11 DIAGNOSIS — E236 Other disorders of pituitary gland: Secondary | ICD-10-CM | POA: Insufficient documentation

## 2020-12-11 MED ORDER — GADOBENATE DIMEGLUMINE 529 MG/ML IV SOLN: 10.0000 mL | Freq: Once | INTRAVENOUS | Status: DC | PRN

## 2020-12-11 MED ORDER — GADOBUTROL 1 MMOL/ML IV SOLN
10.0000 mL | Freq: Once | INTRAVENOUS | Status: AC | PRN
  Administered 2020-12-11: 10 mL via INTRAVENOUS

## 2021-03-15 ENCOUNTER — Other Ambulatory Visit: Payer: Self-pay | Admitting: Internal Medicine

## 2021-03-15 DIAGNOSIS — D352 Benign neoplasm of pituitary gland: Secondary | ICD-10-CM

## 2021-07-03 ENCOUNTER — Ambulatory Visit
Admission: RE | Admit: 2021-07-03 | Discharge: 2021-07-03 | Disposition: A | Discharge status: Home / Self Care | Payer: 59 | Source: Ambulatory Visit | Attending: Internal Medicine | Admitting: Internal Medicine

## 2021-07-03 DIAGNOSIS — D352 Benign neoplasm of pituitary gland: Secondary | ICD-10-CM | POA: Insufficient documentation

## 2021-07-03 MED ORDER — GADOBUTROL 1 MMOL/ML IV SOLN
10.0000 mL | Freq: Once | INTRAVENOUS | Status: AC | PRN
  Administered 2021-07-03: 10 mL via INTRAVENOUS

## 2022-02-15 ENCOUNTER — Other Ambulatory Visit: Payer: Self-pay | Admitting: Internal Medicine

## 2022-02-15 DIAGNOSIS — D352 Benign neoplasm of pituitary gland: Secondary | ICD-10-CM

## 2022-03-28 ENCOUNTER — Other Ambulatory Visit: Payer: Self-pay | Admitting: Internal Medicine

## 2022-03-28 DIAGNOSIS — D352 Benign neoplasm of pituitary gland: Secondary | ICD-10-CM

## 2022-06-18 ENCOUNTER — Encounter: Payer: Self-pay | Admitting: Internal Medicine

## 2022-06-19 ENCOUNTER — Ambulatory Visit
Admission: RE | Admit: 2022-06-19 | Discharge: 2022-06-19 | Disposition: A | Discharge status: Home / Self Care | Payer: 59 | Source: Ambulatory Visit | Attending: Internal Medicine | Admitting: Internal Medicine

## 2022-06-19 DIAGNOSIS — D352 Benign neoplasm of pituitary gland: Secondary | ICD-10-CM

## 2022-06-19 MED ORDER — GADOBENATE DIMEGLUMINE 529 MG/ML IV SOLN
10.0000 mL | Freq: Once | INTRAVENOUS | Status: AC | PRN
  Administered 2022-06-19: 10 mL via INTRAVENOUS

## 2023-02-27 IMAGING — CT CT MAXILLOFACIAL W/ CM
3 of 6 series · 15 of 47 positions shown, 18 images · IV contrast (omnipaque)
Comparison: None.

CLINICAL DATA: Maxillofacial abscess. Right ear pain and facial
numbness for 3 days. Right facial droop.

EXAM:
CT MAXILLOFACIAL WITH CONTRAST
TECHNIQUE: Multidetector CT imaging of the maxillofacial structures was
performed with intravenous contrast. Multiplanar CT image
reconstructions were also generated.
CONTRAST:  75mL OMNIPAQUE IOHEXOL 300 MG/ML  SOLN

[Series 2: max soft · axial · 0.43mm/px · z∈[+254,+404]mm · 10 of 89 slices shown, 13 images]
[im 9/89  brain]
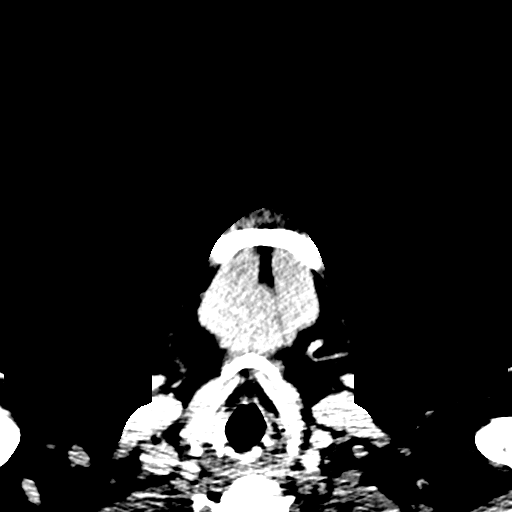
[im 9/89  bone]
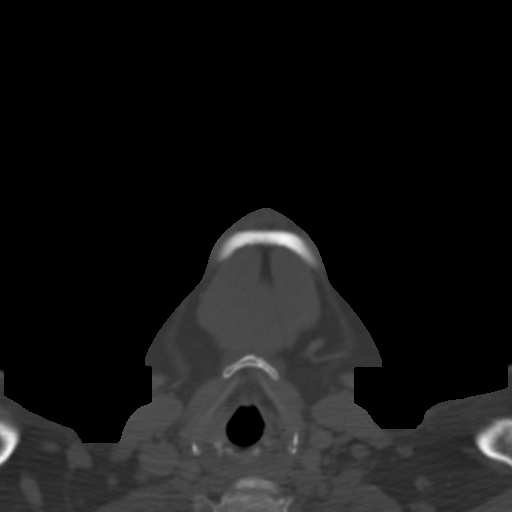
[im 17/89  bone]
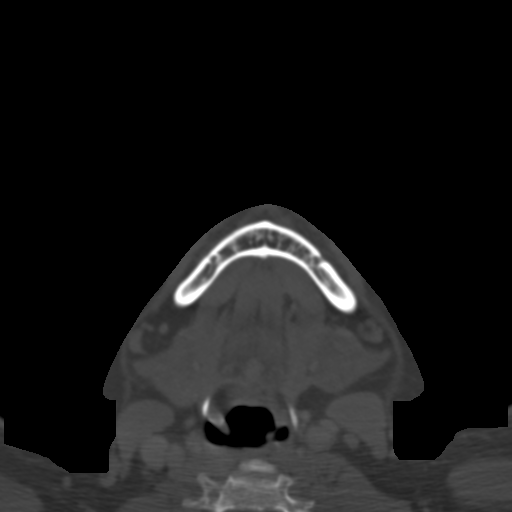
[im 26/89  bone]
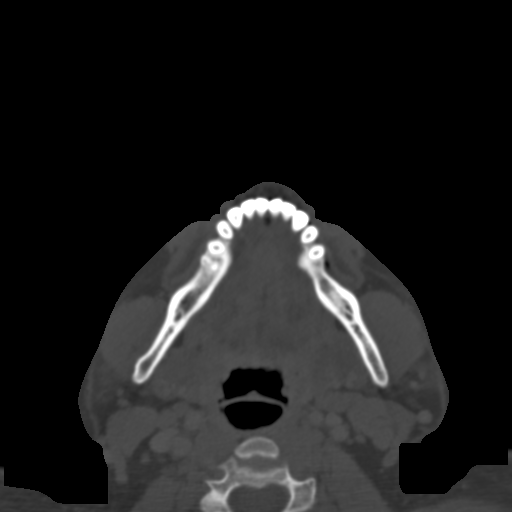
[im 34/89  bone]
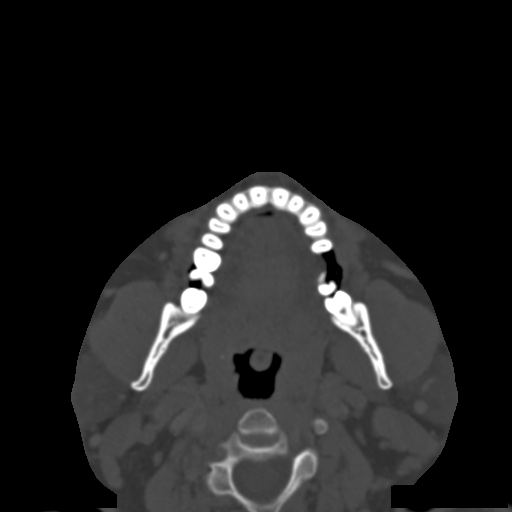
[im 42/89  brain]
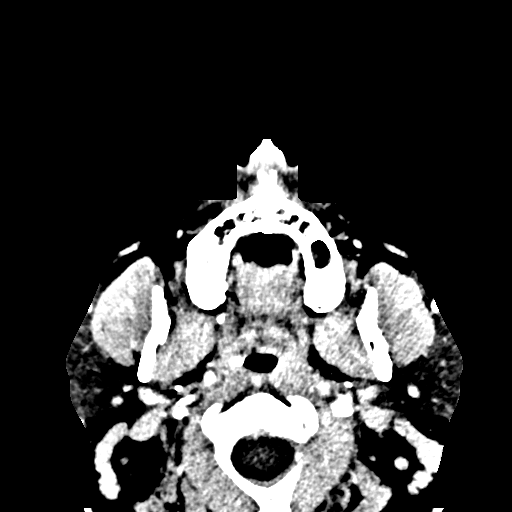
[im 42/89  bone]
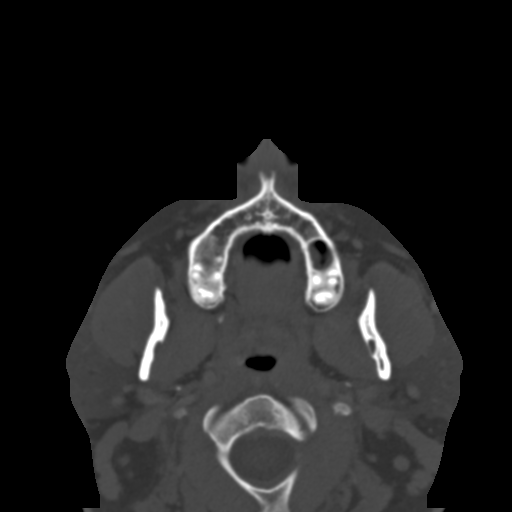
[im 51/89  bone]
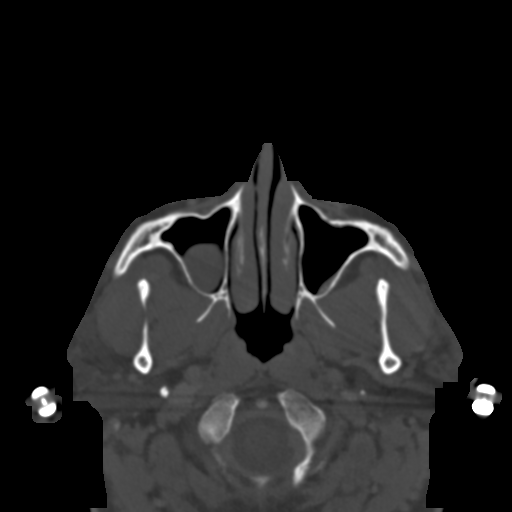
[im 59/89  bone]
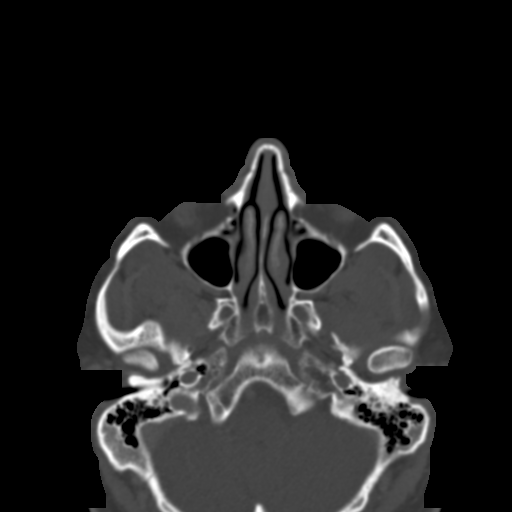
[im 68/89  bone]
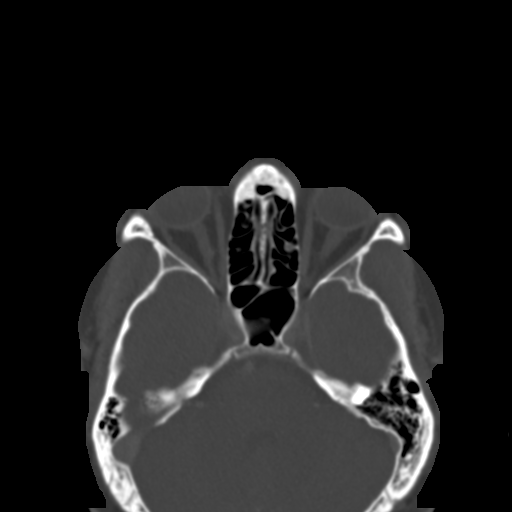
[im 76/89  brain]
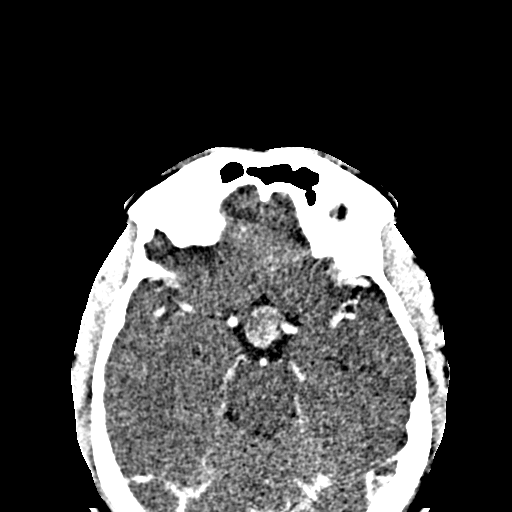
[im 76/89  bone]
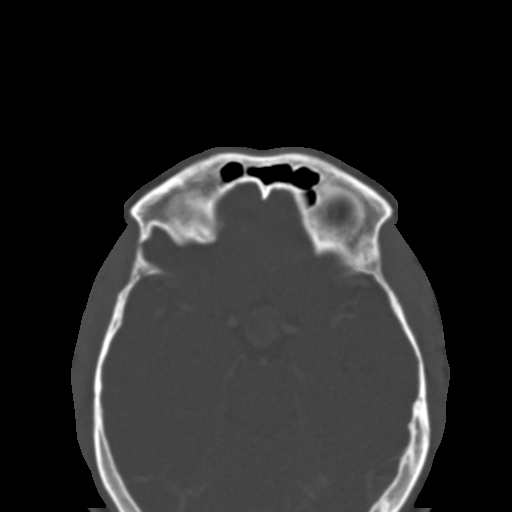
[im 84/89  bone]
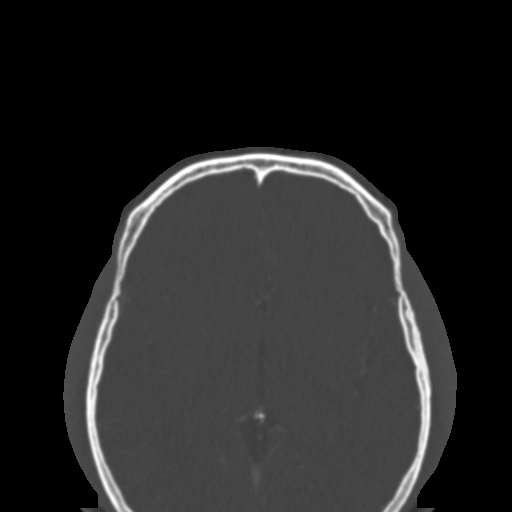

[Series 6: coronal soft · coronal · 0.35mm/px · 3 of 80 slices shown]
[im 20/80  bone]
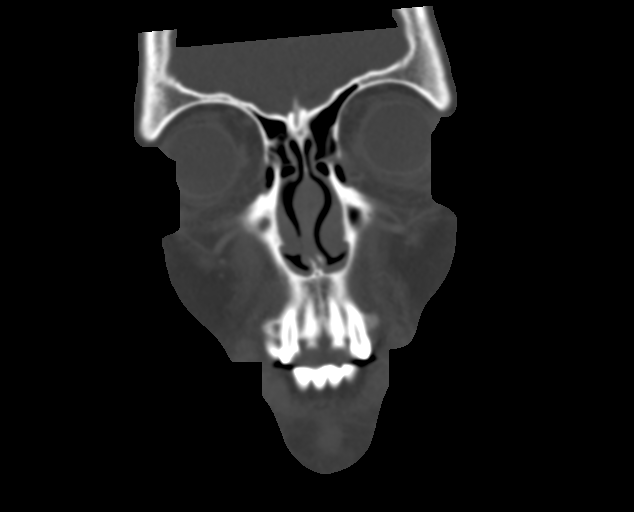
[im 40/80  bone]
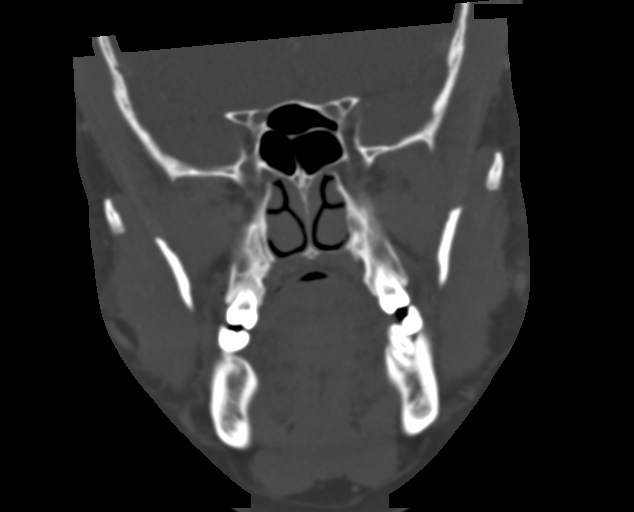
[im 60/80  bone]
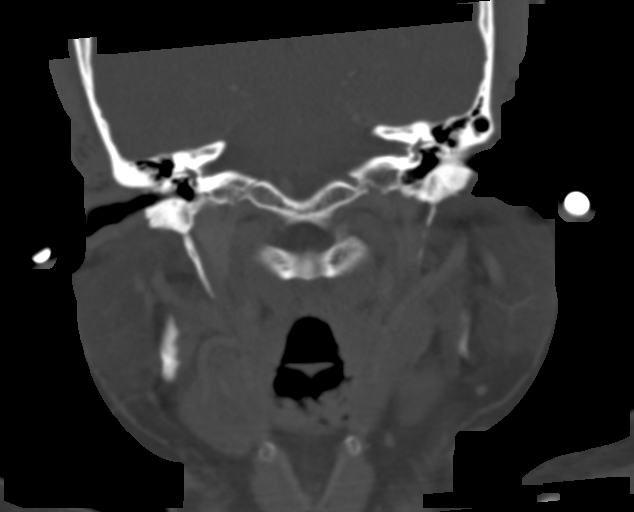

[Series 9: sagittal soft · sagittal · 0.35mm/px · 2 of 99 slices shown]
[im 33/99  bone]
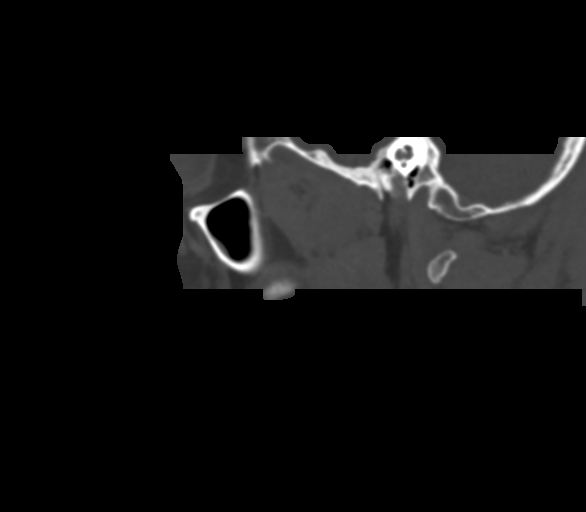
[im 66/99  bone]
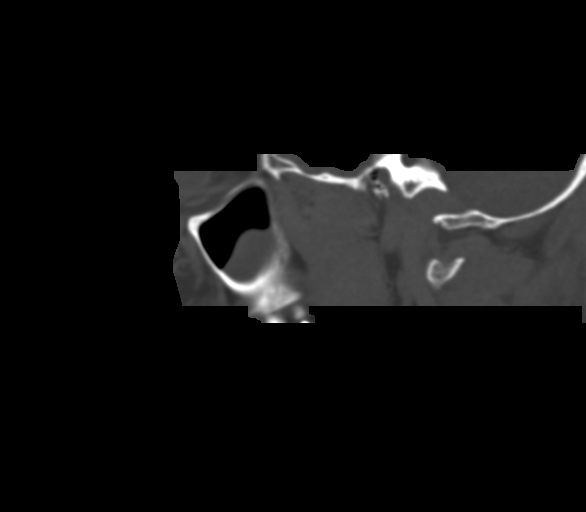

[15 of 47 positions shown; findings below may reference images not displayed]

FINDINGS: Osseous: No fracture or destructive osseous process.

Orbits: Unremarkable.

Sinuses: Partially polypoid mucosal thickening in the maxillary
sinuses, moderate on the right and mild on the left. No fluid. Clear
mastoid air cells.

Soft tissues: No evidence of significant maxillofacial soft tissue
inflammation, fluid collection, or mass. Unremarkable parotid and
submandibular glands.

Limited intracranial: Sellar and suprasellar soft tissue mass
measuring 1.6 x 1.9 x 2.1 cm (AP x transverse x craniocaudal)
displacing the optic chiasm.
IMPRESSION: 1. 2.1 cm sellar/suprasellar mass most suggestive of a pituitary
macroadenoma. Referral for endocrine and/or neurosurgical management
is recommended. This follows ACR consensus guidelines: Management of
Incidental Pituitary Findings on CT, MRI and F18-FDG PET: A White
Paper of the ACR Incidental Findings Committee. [HOSPITAL]
6744; 15: 966-72. Pituitary protocol brain MRI without and with
contrast is recommended for further evaluation.
2. No acute maxillofacial abnormality identified.

## 2024-06-11 ENCOUNTER — Other Ambulatory Visit: Payer: Self-pay | Admitting: Internal Medicine

## 2024-06-11 DIAGNOSIS — D352 Benign neoplasm of pituitary gland: Secondary | ICD-10-CM

## 2024-06-14 ENCOUNTER — Ambulatory Visit
Admission: RE | Admit: 2024-06-14 | Discharge: 2024-06-14 | Disposition: A | Discharge status: Home / Self Care | Payer: Self-pay | Source: Ambulatory Visit | Attending: Internal Medicine | Admitting: Internal Medicine

## 2024-06-14 DIAGNOSIS — D352 Benign neoplasm of pituitary gland: Secondary | ICD-10-CM | POA: Diagnosis present

## 2024-06-14 MED ORDER — GADOBUTROL 1 MMOL/ML IV SOLN
9.0000 mL | Freq: Once | INTRAVENOUS | Status: AC | PRN
  Administered 2024-06-14: 9 mL via INTRAVENOUS
